# Patient Record
Sex: Female | Born: 1970 | Race: Black or African American | Hispanic: No | Marital: Single
Health system: Southern US, Community
[De-identification: ages and names within clinical notes are randomized; demographics above are authoritative.]

## PROBLEM LIST (undated history)

## (undated) DIAGNOSIS — M069 Rheumatoid arthritis, unspecified: Secondary | ICD-10-CM

## (undated) DIAGNOSIS — D571 Sickle-cell disease without crisis: Secondary | ICD-10-CM

---

## 2019-04-26 ENCOUNTER — Other Ambulatory Visit: Payer: Self-pay

## 2019-04-26 ENCOUNTER — Emergency Department (HOSPITAL_COMMUNITY)
Admission: EM | Admit: 2019-04-26 | Discharge: 2019-04-26 | Disposition: A | Discharge status: Home / Self Care | Payer: 59 | Attending: Emergency Medicine | Admitting: Emergency Medicine

## 2019-04-26 ENCOUNTER — Encounter (HOSPITAL_COMMUNITY): Payer: Self-pay | Admitting: Radiology

## 2019-04-26 ENCOUNTER — Emergency Department (HOSPITAL_COMMUNITY): Payer: 59

## 2019-04-26 DIAGNOSIS — R109 Unspecified abdominal pain: Secondary | ICD-10-CM

## 2019-04-26 DIAGNOSIS — E876 Hypokalemia: Secondary | ICD-10-CM | POA: Diagnosis not present

## 2019-04-26 DIAGNOSIS — R112 Nausea with vomiting, unspecified: Secondary | ICD-10-CM | POA: Insufficient documentation

## 2019-04-26 DIAGNOSIS — R197 Diarrhea, unspecified: Secondary | ICD-10-CM | POA: Diagnosis not present

## 2019-04-26 LAB — CBC WITH DIFFERENTIAL/PLATELET
Abs Immature Granulocytes: 0.01 10*3/uL (ref 0.00–0.07)
Basophils Absolute: 0 10*3/uL (ref 0.0–0.1)
Basophils Relative: 1 %
Eosinophils Absolute: 0 10*3/uL (ref 0.0–0.5)
Eosinophils Relative: 1 %
HCT: 45.8 % (ref 36.0–46.0)
Hemoglobin: 15.6 g/dL — ABNORMAL HIGH (ref 12.0–15.0)
Immature Granulocytes: 0 %
Lymphocytes Relative: 25 %
Lymphs Abs: 1.6 10*3/uL (ref 0.7–4.0)
MCH: 32.8 pg (ref 26.0–34.0)
MCHC: 34.1 g/dL (ref 30.0–36.0)
MCV: 96.4 fL (ref 80.0–100.0)
Monocytes Absolute: 0.6 10*3/uL (ref 0.1–1.0)
Monocytes Relative: 10 %
Neutro Abs: 4 10*3/uL (ref 1.7–7.7)
Neutrophils Relative %: 63 %
Platelets: 162 10*3/uL (ref 150–400)
RBC: 4.75 MIL/uL (ref 3.87–5.11)
RDW: 11.6 % (ref 11.5–15.5)
WBC: 6.3 10*3/uL (ref 4.0–10.5)
nRBC: 0 % (ref 0.0–0.2)

## 2019-04-26 LAB — COMPREHENSIVE METABOLIC PANEL
ALT: 26 U/L (ref 0–44)
AST: 40 U/L (ref 15–41)
Albumin: 4.6 g/dL (ref 3.5–5.0)
Alkaline Phosphatase: 47 U/L (ref 38–126)
Anion gap: 10 (ref 5–15)
BUN: 11 mg/dL (ref 6–20)
CO2: 26 mmol/L (ref 22–32)
Calcium: 9.6 mg/dL (ref 8.9–10.3)
Chloride: 100 mmol/L (ref 98–111)
Creatinine, Ser: 0.77 mg/dL (ref 0.44–1.00)
GFR calc Af Amer: >60 mL/min (ref >60)
GFR calc non Af Amer: >60 mL/min (ref >60)
Glucose, Bld: 93 mg/dL (ref 70–99)
Potassium: 3.1 mmol/L — ABNORMAL LOW (ref 3.5–5.1)
Sodium: 136 mmol/L (ref 135–145)
Total Bilirubin: 1.6 mg/dL — ABNORMAL HIGH (ref 0.3–1.2)
Total Protein: 7.6 g/dL (ref 6.5–8.1)

## 2019-04-26 LAB — URINALYSIS, ROUTINE W REFLEX MICROSCOPIC
Bilirubin Urine: NEGATIVE
Glucose, UA: NEGATIVE mg/dL
Hgb urine dipstick: NEGATIVE
Ketones, ur: 20 mg/dL — AB
Leukocytes,Ua: NEGATIVE
Nitrite: NEGATIVE
Protein, ur: NEGATIVE mg/dL
Specific Gravity, Urine: 1.035 — ABNORMAL HIGH (ref 1.005–1.030)
pH: 6 (ref 5.0–8.0)

## 2019-04-26 LAB — LIPASE, BLOOD: Lipase: 26 U/L (ref 11–51)

## 2019-04-26 LAB — POC OCCULT BLOOD, ED: Fecal Occult Bld: NEGATIVE

## 2019-04-26 MED ORDER — DICYCLOMINE HCL 20 MG PO TABS: 20.0000 mg | ORAL_TABLET | Freq: Two times a day (BID) | ORAL | 0 refills | Status: AC

## 2019-04-26 MED ORDER — IOHEXOL 300 MG/ML  SOLN
75.0000 mL | Freq: Once | INTRAMUSCULAR | Status: AC | PRN
  Administered 2019-04-26: 75 mL via INTRAVENOUS

## 2019-04-26 MED ORDER — PROMETHAZINE HCL 25 MG PO TABS: 25.0000 mg | ORAL_TABLET | Freq: Four times a day (QID) | ORAL | 0 refills | Status: AC | PRN

## 2019-04-26 MED ORDER — DICYCLOMINE HCL 10 MG PO CAPS
10.0000 mg | ORAL_CAPSULE | Freq: Once | ORAL | Status: AC
  Administered 2019-04-26: 10 mg via ORAL
  Filled 2019-04-26: qty 1

## 2019-04-26 MED ORDER — SODIUM CHLORIDE 0.9 % IV BOLUS
1000.0000 mL | Freq: Once | INTRAVENOUS | Status: AC
  Administered 2019-04-26: 11:00:00 1000 mL via INTRAVENOUS

## 2019-04-26 MED ORDER — POTASSIUM CHLORIDE CRYS ER 20 MEQ PO TBCR
40.0000 meq | EXTENDED_RELEASE_TABLET | Freq: Once | ORAL | Status: AC
  Administered 2019-04-26: 40 meq via ORAL
  Filled 2019-04-26: qty 2

## 2019-04-26 MED ORDER — POTASSIUM CHLORIDE CRYS ER 20 MEQ PO TBCR: 20.0000 meq | EXTENDED_RELEASE_TABLET | Freq: Two times a day (BID) | ORAL | 0 refills | Status: AC

## 2019-04-26 MED ORDER — MORPHINE SULFATE (PF) 4 MG/ML IV SOLN
4.0000 mg | Freq: Once | INTRAVENOUS | Status: AC
  Administered 2019-04-26: 11:00:00 4 mg via INTRAVENOUS
  Filled 2019-04-26: qty 1

## 2019-04-26 MED ORDER — SODIUM CHLORIDE (PF) 0.9 % IJ SOLN
INTRAMUSCULAR | Status: AC
  Filled 2019-04-26: qty 50

## 2019-04-26 MED ORDER — FAMOTIDINE 20 MG PO TABS: 20.0000 mg | ORAL_TABLET | Freq: Two times a day (BID) | ORAL | 0 refills | Status: AC

## 2019-04-26 MED ORDER — METOCLOPRAMIDE HCL 5 MG/ML IJ SOLN
10.0000 mg | Freq: Once | INTRAMUSCULAR | Status: AC
  Administered 2019-04-26: 10 mg via INTRAVENOUS
  Filled 2019-04-26: qty 2

## 2019-04-26 NOTE — ED Triage Notes (Signed)
Patient states she feels like there are two balls in her stomach on each side.

## 2019-04-26 NOTE — ED Triage Notes (Signed)
Patient presents with several complaints. Says she feels bloated. Her chest hurts. She is cold and hot. She vomited 4 times in two days. Hasn't been able to eat.

## 2019-04-26 NOTE — Discharge Instructions (Signed)
Please read and follow all provided instructions.  Your diagnoses today include:  1. Nausea vomiting and diarrhea   2. Abdominal cramps   3. Hypokalemia     Tests performed today include:  Blood counts and electrolytes - low potassium  Blood tests to check liver and kidney function  Blood tests to check pancreas function  Urine test to look for infection  CT scan - no signs of problems  Vital signs. See below for your results today.   Medications prescribed:   Phenergan (promethazine) - for nausea and vomiting   Bentyl - medication for intestinal cramps and spasms   Pepcid (famotidine) - antihistamine  You can find this medication over-the-counter.   DO NOT exceed:   20mg  Pepcid every 12 hours  Take any prescribed medications only as directed.  Home care instructions:   Follow any educational materials contained in this packet.  Follow-up instructions: Please follow-up with your primary care provider in the next 3 days for further evaluation of your symptoms.    Return instructions:  SEEK IMMEDIATE MEDICAL ATTENTION IF:  The pain does not go away or becomes severe   A temperature above 101F develops   Repeated vomiting occurs (multiple episodes)   The pain becomes localized to portions of the abdomen. The right side could possibly be appendicitis. In an adult, the left lower portion of the abdomen could be colitis or diverticulitis.   Blood is being passed in stools or vomit (bright red or black tarry stools)   You develop chest pain, difficulty breathing, dizziness or fainting, or become confused, poorly responsive, or inconsolable (young children)  If you have any other emergent concerns regarding your health  Additional Information: Abdominal (belly) pain can be caused by many things. Your caregiver performed an examination and possibly ordered blood/urine tests and imaging (CT scan, x-rays, ultrasound). Many cases can be observed and treated at  home after initial evaluation in the emergency department. Even though you are being discharged home, abdominal pain can be unpredictable. Therefore, you need a repeated exam if your pain does not resolve, returns, or worsens. Most patients with abdominal pain don't have to be admitted to the hospital or have surgery, but serious problems like appendicitis and gallbladder attacks can start out as nonspecific pain. Many abdominal conditions cannot be diagnosed in one visit, so follow-up evaluations are very important.  Your vital signs today were: BP (!) 147/98    Pulse 73    Temp 98.4 F (36.9 C) (Oral)    Resp 18    Ht 5\' 3"  (1.6 m)    Wt 50.8 kg    SpO2 100%    BMI 19.84 kg/m  If your blood pressure (bp) was elevated above 135/85 this visit, please have this repeated by your doctor within one month. --------------

## 2019-04-26 NOTE — ED Provider Notes (Signed)
[facility]   CSN: [id number] Arrival date & time: 04/26/19  0941     History   Chief Complaint Chief Complaint  Patient presents with   Abdominal Pain    HPI Carmen Hill is a 48 y.o. female.     Patient with history of tubal ligation (question hysterectomy, patient is not sure) presents with complaint of nausea, vomiting, and diarrhea.  Symptoms have been going on for 6 to 7 days.  Patient developed pain 5 days ago.  Pain is all over her abdomen, worse around her umbilicus.  It is cramping in nature.  It comes and goes.  Patient has had blood in her stool at times.  She describes a hard feeling like a "ball" in her abdomen.  Patient states that she has been constipated as well.  No urinary symptoms.  She denies fever.  She has had chest tightness with these episodes as well.  She has been able to keep down very little fluids.  She was [place] at a hospital in [place] She states that she had blood work there.  These records are not in care everywhere.       No past medical history on file.  There are no active problems to display for this patient.   The histories are not reviewed yet. Please review them in the "History" navigator section and refresh this SmartLink.   OB History   No obstetric history on file.      Home Medications    Prior to Admission medications   Not on File    Family History No family history on file.  Social History Social History   Tobacco Use   Smoking status: Not on file  Substance Use Topics   Alcohol use: Not on file   Drug use: Not on file     Allergies   Patient has no allergy information on record.   Review of Systems Review of Systems  Constitutional: Negative for fever.  HENT: Negative for rhinorrhea and sore throat.   Eyes: Negative for redness.  Respiratory: Positive for chest tightness. Negative for cough.   Cardiovascular: Negative for chest pain.    Gastrointestinal: Positive for abdominal pain, blood in stool, diarrhea, nausea and vomiting.  Genitourinary: Negative for dysuria.  Musculoskeletal: Negative for myalgias.  Skin: Negative for rash.  Neurological: Negative for headaches.     Physical Exam Updated Vital Signs BP (!) 151/97 (BP Location: Left Arm)    Pulse 73    Temp 98.4 F (36.9 C) (Oral)    Resp 16    Ht 5\' 3"  (1.6 m)    Wt 50.8 kg    SpO2 100%    BMI 19.84 kg/m   Physical Exam Vitals signs and nursing note reviewed.  Constitutional:      Appearance: She is well-developed.  HENT:     Head: Normocephalic and atraumatic.  Eyes:     General:        Right eye: No discharge.        Left eye: No discharge.     Conjunctiva/sclera: Conjunctivae normal.  Neck:     Musculoskeletal: Normal range of motion and neck supple.  Cardiovascular:     Rate and Rhythm: Normal rate and regular rhythm.     Heart sounds: Normal heart sounds.  Pulmonary:     Effort: Pulmonary effort is normal.     Breath sounds: Normal breath sounds.  Abdominal:     Palpations:  Abdomen is soft.     Tenderness: There is abdominal tenderness in the periumbilical area. There is no guarding or rebound. Negative signs include Murphy's sign and McBurney's sign.     Hernia: No hernia is present.  Skin:    General: Skin is warm and dry.  Neurological:     Mental Status: She is alert.      ED Treatments / Results  Labs (all labs ordered are listed, but only abnormal results are displayed) Labs Reviewed  CBC WITH DIFFERENTIAL/PLATELET - Abnormal; Notable for the following components:      Result Value   Hemoglobin 15.6 (*)    All other components within normal limits  COMPREHENSIVE METABOLIC PANEL - Abnormal; Notable for the following components:   Potassium 3.1 (*)    Total Bilirubin 1.6 (*)    All other components within normal limits  URINALYSIS, ROUTINE W REFLEX MICROSCOPIC - Abnormal; Notable for the following components:   Specific  Gravity, Urine 1.035 (*)    Ketones, ur 20 (*)    All other components within normal limits  LIPASE, BLOOD  POC OCCULT BLOOD, ED    EKG EKG Interpretation  Date/Time:  Wednesday April 26 2019 11:17:38 EDT Ventricular Rate:  59 PR Interval:    QRS Duration: 88 QT Interval:  427 QTC Calculation: 423 R Axis:   -81 Text Interpretation:  Sinus or ectopic atrial rhythm Short PR interval Left anterior fascicular block Anterolateral infarct, old No old tracing to compare Confirmed by Daleen Bo [phone]) on 04/26/2019 11:42:50 AM   Radiology Ct Abdomen Pelvis W Contrast  Result Date: 04/26/2019 CLINICAL DATA:  Abdominal pain, nausea, vomiting and diarrhea with alternating chills and fever for 5 days. EXAM: CT ABDOMEN AND PELVIS WITH CONTRAST TECHNIQUE: Multidetector CT imaging of the abdomen and pelvis was performed using the standard protocol following bolus administration of intravenous contrast. CONTRAST:  75 mL OMNIPAQUE IOHEXOL 300 MG/ML  SOLN COMPARISON:  None. FINDINGS: Lower chest: Lung bases are clear. No pleural or pericardial effusion. Hepatobiliary: No focal liver abnormality is seen. No gallstones, gallbladder wall thickening, or biliary dilatation. Pancreas: Unremarkable. No pancreatic ductal dilatation or surrounding inflammatory changes. Spleen: Normal in size without focal abnormality. Adrenals/Urinary Tract: Adrenal glands are unremarkable. Kidneys are normal, without renal calculi, focal lesion, or hydronephrosis. Bladder is unremarkable. Stomach/Bowel: Stomach is within normal limits. Appendix appears normal. No evidence of bowel wall thickening, distention, or inflammatory changes. Vascular/Lymphatic: No significant vascular findings are present. No enlarged abdominal or pelvic lymph nodes. Reproductive: Uterus and bilateral adnexa are unremarkable. Other: None. Musculoskeletal: Normal. IMPRESSION: Negative CT abdomen and pelvis. No finding to explain the patient's symptoms.  Electronically Signed   By: Inge Rise M.D.   On: 04/26/2019 12:05    Procedures Procedures (including critical care time)  Medications Ordered in ED Medications  sodium chloride (PF) 0.9 % injection (has no administration in time range)  metoCLOPramide (REGLAN) injection 10 mg (10 mg Intravenous Given 04/26/19 1100)  sodium chloride 0.9 % bolus 1,000 mL (1,000 mLs Intravenous New Bag/Given 04/26/19 1100)  morphine 4 MG/ML injection 4 mg (4 mg Intravenous Given 04/26/19 1101)  potassium chloride SA (K-DUR) CR tablet 40 mEq (40 mEq Oral Given 04/26/19 1154)  iohexol (OMNIPAQUE) 300 MG/ML solution 75 mL (75 mLs Intravenous Contrast Given 04/26/19 1132)     Initial Impression / Assessment and Plan / ED Course  I have reviewed the triage vital signs and the nursing notes.  Pertinent labs & imaging results that  were available during my care of the patient were reviewed by me and considered in my medical decision making (see chart for details).        Patient seen and examined. Work-up initiated. Medications ordered. She may need CT imaging given duration of symptoms, lack of improvement.    Vital signs reviewed and are as follows: BP (!) 151/97 (BP Location: Left Arm)    Pulse 73    Temp 98.4 F (36.9 C) (Oral)    Resp 16    Ht 5\' 3"  (1.6 m)    Wt 50.8 kg    SpO2 100%    BMI 19.84 kg/m   1:29 PM patient updated on results.  She is feeling better after she was able to sleep for a while.  Minimal generalized tenderness without rebound or guarding on reexam.    Patient has received IV fluids and oral potassium.  She is not vomiting.  Will discharge to home with Phenergan, Bentyl, potassium, Pepcid.  The patient was urged to return to the Emergency Department immediately with worsening of current symptoms, worsening abdominal pain, persistent vomiting, blood noted in stools, fever, or any other concerns. The patient verbalized understanding.   She does not have a PCP as she moved to the  area 6 months ago.  [facility] referral given.   Final Clinical Impressions(s) / ED Diagnoses   Final diagnoses:  Nausea vomiting and diarrhea  Abdominal cramps  Hypokalemia   N/V/D, cramps: Patient does have some mild hypokalemia, repleted.  Hemoccult negative.  Suspect mild dehydration, treated with IV fluids.  Given duration of symptoms, CT imaging performed and did not show a cause of the patient symptoms.  No signs of colitis, abscess, other obstruction.  Comfortable discharged home at this time with continued symptom control.  PCP referrals given.  Return structures as above.  ED Discharge Orders         Ordered    promethazine (PHENERGAN) 25 MG tablet  Every 6 hours PRN     04/26/19 1326    dicyclomine (BENTYL) 20 MG tablet  2 times daily     04/26/19 1326    famotidine (PEPCID) 20 MG tablet  2 times daily     04/26/19 1326    potassium chloride SA (K-DUR) 20 MEQ tablet  2 times daily     04/26/19 1328           Renne Crigler, PA-C 04/26/19 1331    Mancel Bale, MD 04/26/19 1635

## 2020-09-12 ENCOUNTER — Encounter (HOSPITAL_COMMUNITY): Payer: Self-pay

## 2020-09-12 ENCOUNTER — Emergency Department (HOSPITAL_COMMUNITY)
Admission: EM | Admit: 2020-09-12 | Discharge: 2020-09-12 | Disposition: A | Discharge status: Home / Self Care | Payer: 59 | Attending: Emergency Medicine | Admitting: Emergency Medicine

## 2020-09-12 ENCOUNTER — Other Ambulatory Visit: Payer: Self-pay

## 2020-09-12 ENCOUNTER — Emergency Department (HOSPITAL_COMMUNITY): Payer: 59

## 2020-09-12 DIAGNOSIS — D571 Sickle-cell disease without crisis: Secondary | ICD-10-CM | POA: Diagnosis not present

## 2020-09-12 DIAGNOSIS — R1110 Vomiting, unspecified: Secondary | ICD-10-CM | POA: Diagnosis not present

## 2020-09-12 DIAGNOSIS — Z79899 Other long term (current) drug therapy: Secondary | ICD-10-CM | POA: Diagnosis not present

## 2020-09-12 DIAGNOSIS — R109 Unspecified abdominal pain: Secondary | ICD-10-CM

## 2020-09-12 DIAGNOSIS — R1013 Epigastric pain: Secondary | ICD-10-CM | POA: Insufficient documentation

## 2020-09-12 HISTORY — DX: Sickle-cell disease without crisis: D57.1

## 2020-09-12 HISTORY — DX: Rheumatoid arthritis, unspecified: M06.9

## 2020-09-12 LAB — URINALYSIS, ROUTINE W REFLEX MICROSCOPIC
Bacteria, UA: NONE SEEN
Bilirubin Urine: NEGATIVE
Glucose, UA: NEGATIVE mg/dL
Hgb urine dipstick: NEGATIVE
Ketones, ur: 20 mg/dL — AB
Leukocytes,Ua: NEGATIVE
Nitrite: NEGATIVE
Protein, ur: 30 mg/dL — AB
Specific Gravity, Urine: >1.046 — ABNORMAL HIGH (ref 1.005–1.030)
pH: 7 (ref 5.0–8.0)

## 2020-09-12 LAB — COMPREHENSIVE METABOLIC PANEL
ALT: 33 U/L (ref 0–44)
AST: 44 U/L — ABNORMAL HIGH (ref 15–41)
Albumin: 4.6 g/dL (ref 3.5–5.0)
Alkaline Phosphatase: 58 U/L (ref 38–126)
Anion gap: 12 (ref 5–15)
BUN: 11 mg/dL (ref 6–20)
CO2: 20 mmol/L — ABNORMAL LOW (ref 22–32)
Calcium: 8.5 mg/dL — ABNORMAL LOW (ref 8.9–10.3)
Chloride: 107 mmol/L (ref 98–111)
Creatinine, Ser: 0.48 mg/dL (ref 0.44–1.00)
GFR, Estimated: >60 mL/min (ref >60)
Glucose, Bld: 112 mg/dL — ABNORMAL HIGH (ref 70–99)
Potassium: 3.9 mmol/L (ref 3.5–5.1)
Sodium: 139 mmol/L (ref 135–145)
Total Bilirubin: 1 mg/dL (ref 0.3–1.2)
Total Protein: 7.6 g/dL (ref 6.5–8.1)

## 2020-09-12 LAB — CBC
HCT: 43.3 % (ref 36.0–46.0)
Hemoglobin: 14.2 g/dL (ref 12.0–15.0)
MCH: 33.2 pg (ref 26.0–34.0)
MCHC: 32.8 g/dL (ref 30.0–36.0)
MCV: 101.2 fL — ABNORMAL HIGH (ref 80.0–100.0)
Platelets: 151 10*3/uL (ref 150–400)
RBC: 4.28 MIL/uL (ref 3.87–5.11)
RDW: 12.1 % (ref 11.5–15.5)
WBC: 5.8 10*3/uL (ref 4.0–10.5)
nRBC: 0 % (ref 0.0–0.2)

## 2020-09-12 LAB — RETICULOCYTES
Immature Retic Fract: 9.8 % (ref 2.3–15.9)
RBC.: 4.3 MIL/uL (ref 3.87–5.11)
Retic Count, Absolute: 66.7 10*3/uL (ref 19.0–186.0)
Retic Ct Pct: 1.6 % (ref 0.4–3.1)

## 2020-09-12 LAB — I-STAT BETA HCG BLOOD, ED (MC, WL, AP ONLY): I-stat hCG, quantitative: <5 m[IU]/mL (ref <5)

## 2020-09-12 LAB — LIPASE, BLOOD: Lipase: 26 U/L (ref 11–51)

## 2020-09-12 LAB — VALPROIC ACID LEVEL: Valproic Acid Lvl: <10 ug/mL — ABNORMAL LOW (ref 50.0–100.0)

## 2020-09-12 MED ORDER — IOHEXOL 300 MG/ML  SOLN
75.0000 mL | Freq: Once | INTRAMUSCULAR | Status: AC | PRN
  Administered 2020-09-12: 75 mL via INTRAVENOUS

## 2020-09-12 MED ORDER — LACTATED RINGERS IV BOLUS
1000.0000 mL | Freq: Once | INTRAVENOUS | Status: AC
  Administered 2020-09-12: 1000 mL via INTRAVENOUS

## 2020-09-12 MED ORDER — ONDANSETRON 4 MG PO TBDP: 4.0000 mg | ORAL_TABLET | Freq: Three times a day (TID) | ORAL | 0 refills | Status: AC | PRN

## 2020-09-12 MED ORDER — SODIUM CHLORIDE 0.9 % IV BOLUS
1000.0000 mL | Freq: Once | INTRAVENOUS | Status: AC
  Administered 2020-09-12: 1000 mL via INTRAVENOUS

## 2020-09-12 MED ORDER — HALOPERIDOL LACTATE 5 MG/ML IJ SOLN
1.0000 mg | Freq: Once | INTRAMUSCULAR | Status: AC
  Administered 2020-09-12: 1 mg via INTRAVENOUS
  Filled 2020-09-12: qty 1

## 2020-09-12 MED ORDER — LORAZEPAM 2 MG/ML IJ SOLN
0.5000 mg | Freq: Once | INTRAMUSCULAR | Status: AC
  Administered 2020-09-12: 0.5 mg via INTRAVENOUS
  Filled 2020-09-12: qty 1

## 2020-09-12 MED ORDER — MORPHINE SULFATE (PF) 4 MG/ML IV SOLN
4.0000 mg | Freq: Once | INTRAVENOUS | Status: AC
Start: 2020-09-12 — End: 2020-09-12
  Administered 2020-09-12: 4 mg via INTRAVENOUS
  Filled 2020-09-12: qty 1

## 2020-09-12 NOTE — ED Provider Notes (Signed)
50 year old female with past medical history of sickle cell disease brought in by EMS from home.  Patient states that she woke up at 2:00 this morning with nausea, vomiting, abdominal cramping.  States that this is happened frequently in the past and needs IV fluids.  Denies sick contacts, diarrhea, fevers.  Patient was given Zofran, fentanyl, IV fluids by EMS however removed her IV in the lobby.  On exam found to have mild generalized abdominal pain.  Patient is sliding out of the chair and stands up to put herself back into her chair.  MSE was initiated and I personally evaluated the patient and placed orders (if any) at  9:50 AM on September 12, 2020.  The patient appears stable so that the remainder of the MSE may be completed by another provider.   Jeannie Fend, PA-C 09/12/20 8177    Lorre Nick, MD 09/13/20 [phone]

## 2020-09-12 NOTE — ED Provider Notes (Signed)
Medical Decision Making: Care of patient assumed from Dr. Freida Busman at 1500.  Agree with history, physical exam and plan.  See their note for further details.  Briefly, The pt p/w ab pain with hx of thc use, SS disease, hx of similar presnetation, haldol ativan and morphine given, NBNB vomiting.   Current plan is as follows: IVF, CT, PO challenge  Laboratory studies are fairly unremarkable, CT imaging after radiology my review shows only heterogenous uterus, she is told to follow-up with her primary care providers about this.  No emergent findings.  She is feeling much better, she feels safe for discharge home.  Strict return precautions discussed.  Urinalysis reviewed by myself shows no significant findings for infection, some mild ketosis.  Safe for discharge home tolerating p.o.  Patient agrees return precautions discussed  I personally reviewed and interpreted all labs/imaging.      Sabino Donovan, MD 09/12/20 2130

## 2020-09-12 NOTE — ED Notes (Signed)
Patient transported to CT 

## 2020-09-12 NOTE — ED Triage Notes (Signed)
Patient arrived with complaints of NVD and cold sweats that started a few hours ago. Given 4mg  zofran fentanyl and 400cc fluids. Hx of sickle cell.

## 2020-09-12 NOTE — ED Notes (Signed)
Pt unable to urinate at this time, states "I have nothing in me to pee out"

## 2020-09-12 NOTE — ED Notes (Signed)
Patient had an IV that was placed prior to arrival by EMS. Patient pulled the IV out while in the lobby. patient had bleeding from the site. Patient's arm cleaned and a new blanket was given. Patient placed back in the lobby.

## 2020-09-12 NOTE — ED Provider Notes (Signed)
[facility] Provider Note   CSN: [id number] Arrival date & time: 09/12/20  5366     History Chief Complaint  Patient presents with  . Abdominal Pain    Carmen Hill is a 50 y.o. female.  50 year old female presents with diffuse abdominal discomfort.  Patient notes daily use of marijuana.  States has had this before in the past and old records show that she was seen 2 years ago for similar symptoms.  Denies any fever or chills.  Emesis has been persistent.  Abdominal pain is been epigastric and persistent.  Denies any diarrhea.  No urinary symptoms.  No treatment use prior to arrival.  Nothing makes her symptoms worse        Past Medical History:  Diagnosis Date  . Rheumatoid arthritis (HCC)   . Sickle cell anemia (HCC)     There are no problems to display for this patient.   History reviewed. No pertinent surgical history.   OB History   No obstetric history on file.     No family history on file.     Home Medications Prior to Admission medications   Medication Sig Start Date End Date Taking? Authorizing Provider  ARIPiprazole (ABILIFY PO) Take 1 tablet by mouth daily.    [provider]  dicyclomine (BENTYL) 20 MG tablet Take 1 tablet (20 mg total) by mouth 2 (two) times daily. 04/26/19   Renne Crigler, PA-C  divalproex (DEPAKOTE) 250 MG DR tablet Take 250 mg by mouth 3 (three) times daily. 11/11/18   [provider]  famotidine (PEPCID) 20 MG tablet Take 1 tablet (20 mg total) by mouth 2 (two) times daily. 04/26/19   Renne Crigler, PA-C  LORazepam (ATIVAN PO) Take 1 tablet by mouth as needed (anxiety).    [provider]  potassium chloride SA (K-DUR) 20 MEQ tablet Take 1 tablet (20 mEq total) by mouth 2 (two) times daily. 04/26/19   Renne Crigler, PA-C  promethazine (PHENERGAN) 25 MG tablet Take 1 tablet (25 mg total) by mouth every 6 (six) hours as needed for nausea or vomiting. 04/26/19   Renne Crigler, PA-C    Allergies    Patient has no known allergies.  Review of Systems   Review of Systems  All other systems reviewed and are negative.   Physical Exam Updated Vital Signs BP (!) 152/95   Pulse 64   Resp 18   SpO2 100%   Physical Exam Vitals and nursing note reviewed.  Constitutional:      General: She is not in acute distress.    Appearance: Normal appearance. She is well-developed and well-nourished. She is not toxic-appearing.  HENT:     Head: Normocephalic and atraumatic.  Eyes:     General: Lids are normal.     Extraocular Movements: EOM normal.     Conjunctiva/sclera: Conjunctivae normal.     Pupils: Pupils are equal, round, and reactive to light.  Neck:     Thyroid: No thyroid mass.     Trachea: No tracheal deviation.  Cardiovascular:     Rate and Rhythm: Normal rate and regular rhythm.     Heart sounds: Normal heart sounds. No murmur heard. No gallop.   Pulmonary:     Effort: Pulmonary effort is normal. No respiratory distress.     Breath sounds: Normal breath sounds. No stridor. No decreased breath sounds, wheezing, rhonchi or rales.  Abdominal:     General: Bowel sounds are normal. There is no  distension.     Palpations: Abdomen is soft.     Tenderness: There is generalized abdominal tenderness. There is no CVA tenderness, guarding or rebound.  Musculoskeletal:        General: No tenderness or edema. Normal range of motion.     Cervical back: Normal range of motion and neck supple.  Skin:    General: Skin is warm and dry.     Findings: No abrasion or rash.  Neurological:     Mental Status: She is alert and oriented to person, place, and time.     GCS: GCS eye subscore is 4. GCS verbal subscore is 5. GCS motor subscore is 6.     Cranial Nerves: No cranial nerve deficit.     Sensory: No sensory deficit.     Deep Tendon Reflexes: Strength normal.  Psychiatric:        Mood and Affect: Mood and affect normal.        Speech: Speech normal.         Behavior: Behavior normal.     ED Results / Procedures / Treatments   Labs (all labs ordered are listed, but only abnormal results are displayed) Labs Reviewed  COMPREHENSIVE METABOLIC PANEL - Abnormal; Notable for the following components:      Result Value   CO2 20 (*)    Glucose, Bld 112 (*)    Calcium 8.5 (*)    AST 44 (*)    All other components within normal limits  CBC - Abnormal; Notable for the following components:   MCV 101.2 (*)    All other components within normal limits  LIPASE, BLOOD  RETICULOCYTES  URINALYSIS, ROUTINE W REFLEX MICROSCOPIC  RAPID URINE DRUG SCREEN, HOSP PERFORMED  VALPROIC ACID LEVEL  I-STAT BETA HCG BLOOD, ED ([facility], [facility], [facility] ONLY)    EKG None  Radiology No results found.  Procedures Procedures   Medications Ordered in ED Medications  sodium chloride 0.9 % bolus 1,000 mL (has no administration in time range)  haloperidol lactate (HALDOL) injection 1 mg (has no administration in time range)  LORazepam (ATIVAN) injection 0.5 mg (has no administration in time range)  morphine 4 MG/ML injection 4 mg (has no administration in time range)  lactated ringers bolus 1,000 mL (has no administration in time range)    ED Course  I have reviewed the triage vital signs and the nursing notes.  Pertinent labs & imaging results that were available during my care of the patient were reviewed by me and considered in my medical decision making (see chart for details).    MDM Rules/Calculators/A&P                          Patient will be hydrated here with IV fluids.  Given antiemetics and pain medication.  Labs are without leukocytosis.  Abdominal CT ordered due to her diffuse abdominal comfort.  Will signout the patient to Dr. Myrtis Ser Final Clinical Impression(s) / ED Diagnoses Final diagnoses:  None    Rx / DC Orders ED Discharge Orders    None       Lorre Nick, MD 09/12/20 1435

## 2020-09-13 LAB — GC/CHLAMYDIA PROBE AMP (~~LOC~~) NOT AT ARMC
Chlamydia: NEGATIVE
Comment: NEGATIVE
Comment: NORMAL
Neisseria Gonorrhea: NEGATIVE

## 2021-03-02 IMAGING — CT CT ABD-PELV W/ CM
2 of 5 series · 16 of 46 positions shown, 18 images · IV contrast (OMNIPAQUE 300)
Comparison: None.

CLINICAL DATA: Abdominal pain, nausea, vomiting and diarrhea with
alternating chills and fever for 5 days.

EXAM:
CT ABDOMEN AND PELVIS WITH CONTRAST
TECHNIQUE: Multidetector CT imaging of the abdomen and pelvis was performed
using the standard protocol following bolus administration of
intravenous contrast.
CONTRAST:  75 mL OMNIPAQUE IOHEXOL 300 MG/ML  SOLN

[Series 2: axial st · axial · 0.64mm/px · z∈[-371,-51]mm · 13 of 76 slices shown, 15 images]
[im 6/76  soft-tissue]
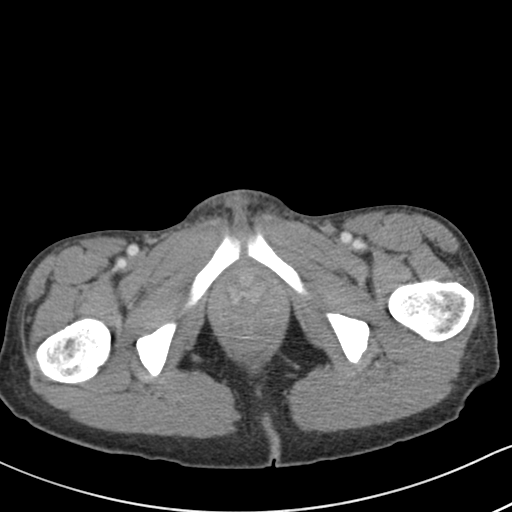
[im 6/76  bone]
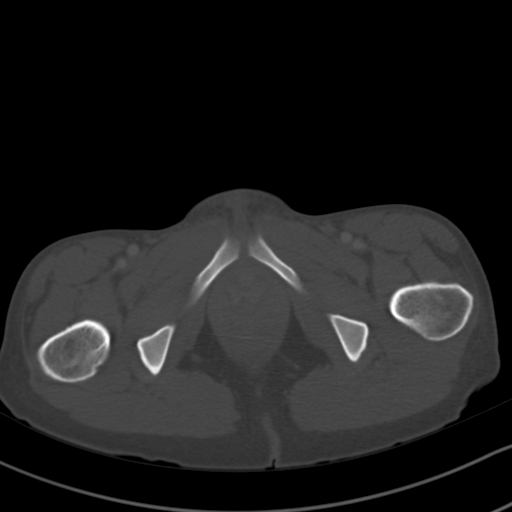
[im 11/76  soft-tissue]
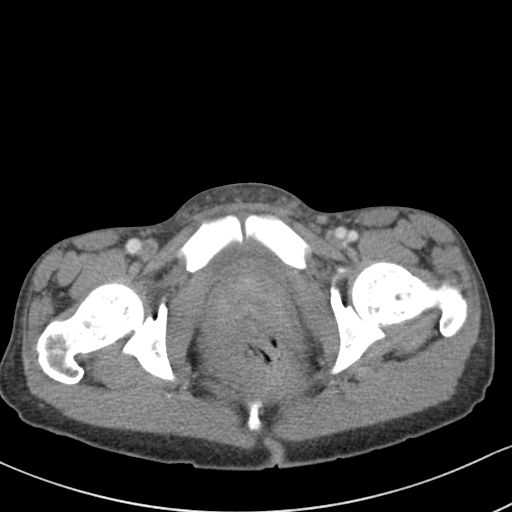
[im 17/76  soft-tissue]
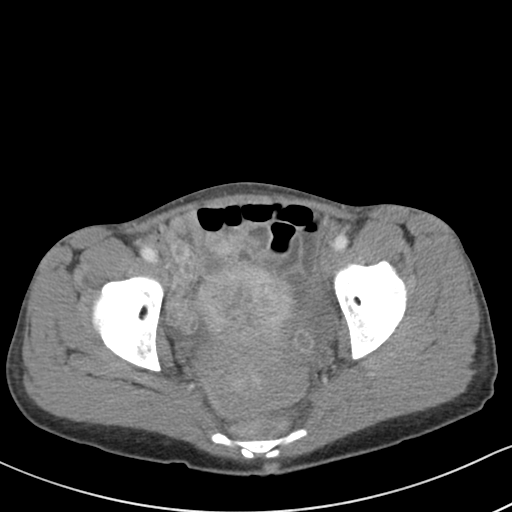
[im 22/76  soft-tissue]
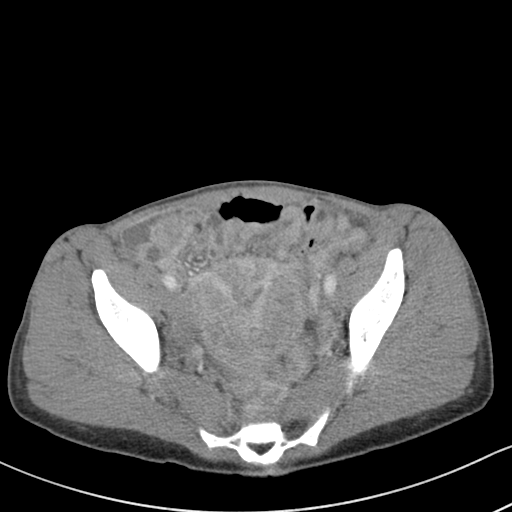
[im 27/76  soft-tissue]
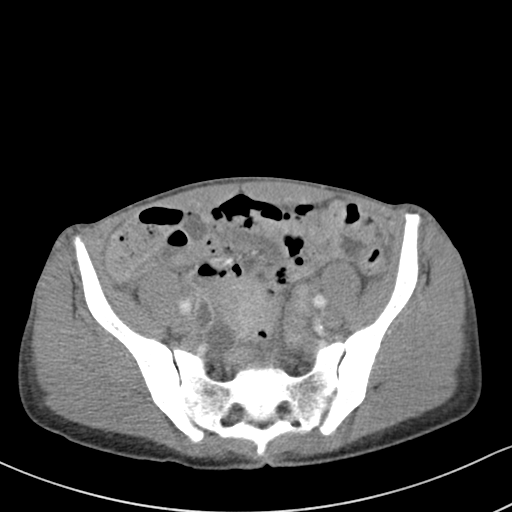
[im 33/76  soft-tissue]
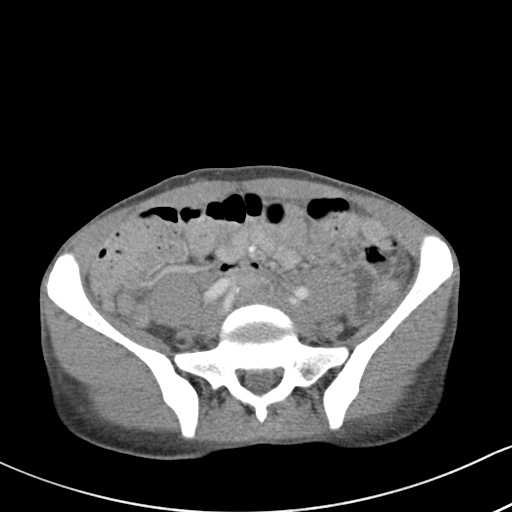
[im 38/76  soft-tissue]
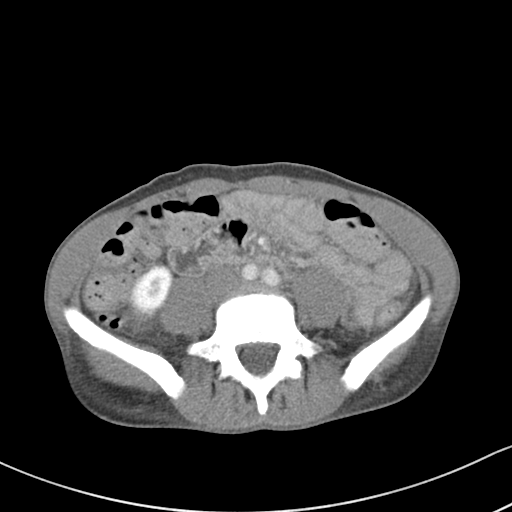
[im 43/76  soft-tissue]
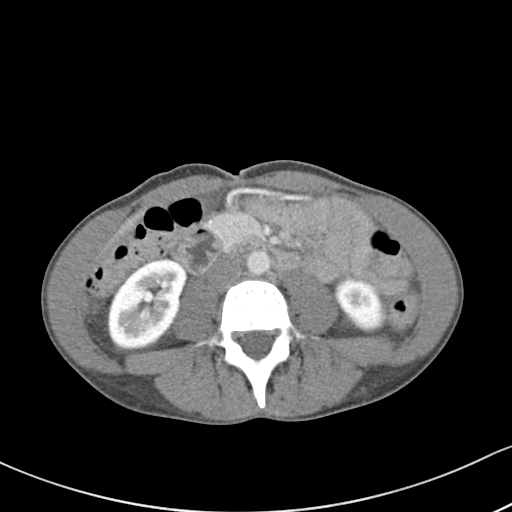
[im 49/76  soft-tissue]
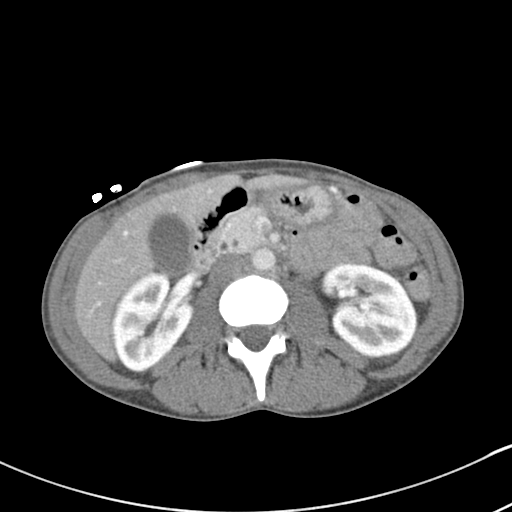
[im 49/76  bone]
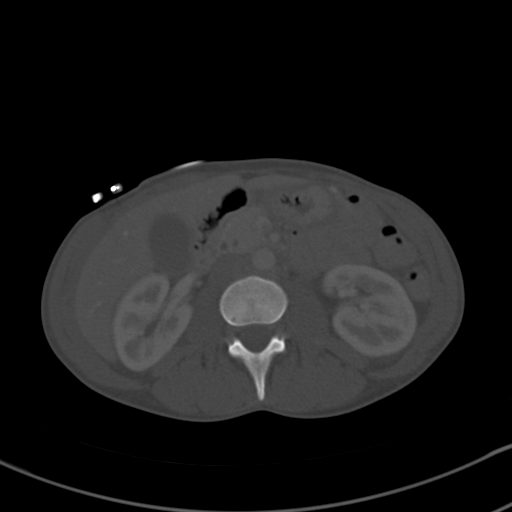
[im 54/76  soft-tissue]
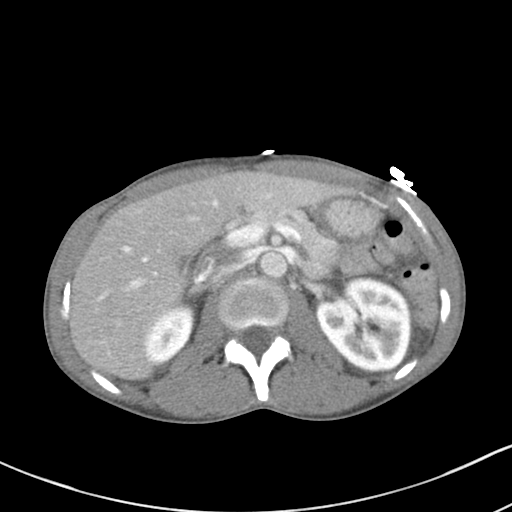
[im 59/76  soft-tissue]
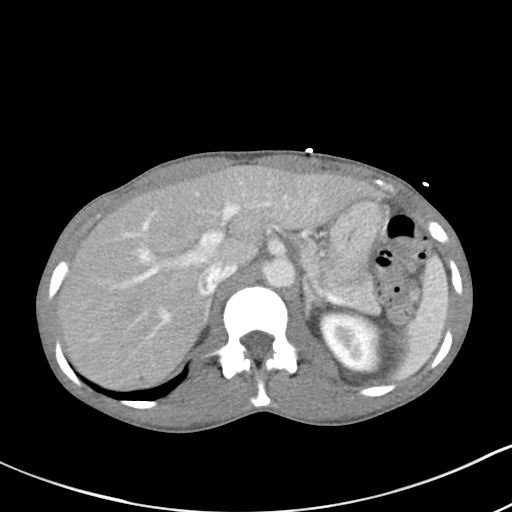
[im 65/76  soft-tissue]
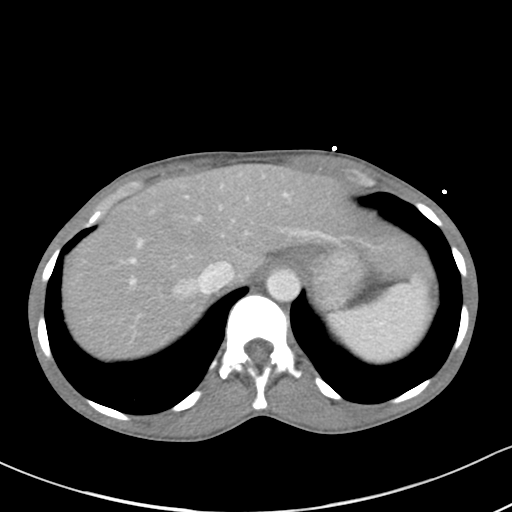
[im 70/76  soft-tissue]
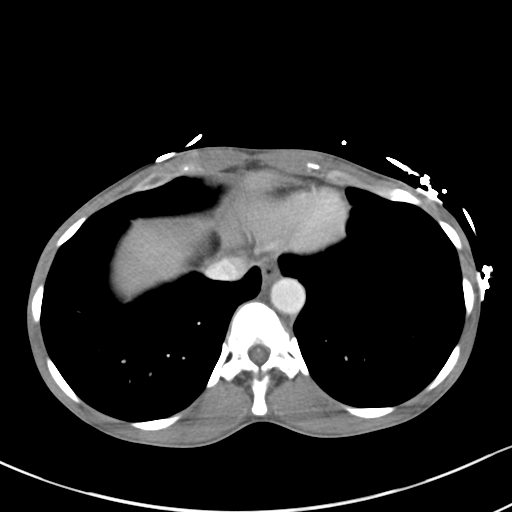

[Series 5: coronal st · coronal · 0.58mm/px · 3 of 97 slices shown]
[im 33/97  soft-tissue]
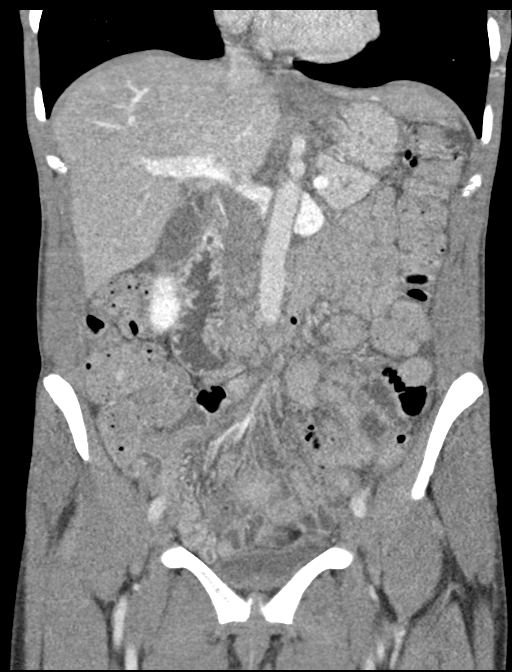
[im 43/97  soft-tissue]
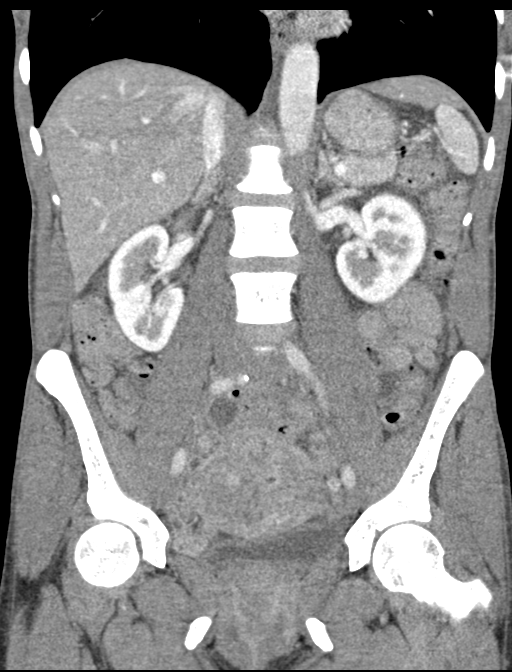
[im 54/97  soft-tissue]
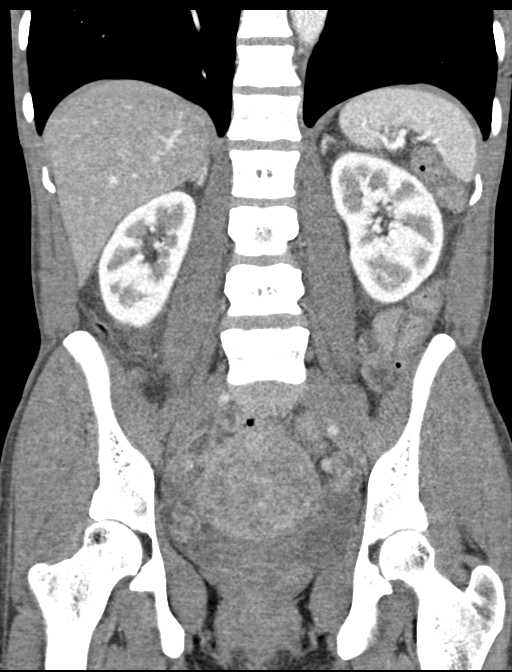

[16 of 46 positions shown; findings below may reference images not displayed]

FINDINGS: Lower chest: Lung bases are clear. No pleural or pericardial
effusion.

Hepatobiliary: No focal liver abnormality is seen. No gallstones,
gallbladder wall thickening, or biliary dilatation.

Pancreas: Unremarkable. No pancreatic ductal dilatation or
surrounding inflammatory changes.

Spleen: Normal in size without focal abnormality.

Adrenals/Urinary Tract: Adrenal glands are unremarkable. Kidneys are
normal, without renal calculi, focal lesion, or hydronephrosis.
Bladder is unremarkable.

Stomach/Bowel: Stomach is within normal limits. Appendix appears
normal. No evidence of bowel wall thickening, distention, or
inflammatory changes.

Vascular/Lymphatic: No significant vascular findings are present. No
enlarged abdominal or pelvic lymph nodes.

Reproductive: Uterus and bilateral adnexa are unremarkable.

Other: None.

Musculoskeletal: Normal.
IMPRESSION: Negative CT abdomen and pelvis. No finding to explain the patient's
symptoms.

## 2021-09-25 ENCOUNTER — Emergency Department (HOSPITAL_COMMUNITY)
Admission: EM | Admit: 2021-09-25 | Discharge: 2021-09-25 | Disposition: A | Discharge status: Home / Self Care | Payer: 59 | Attending: Emergency Medicine | Admitting: Emergency Medicine

## 2021-09-25 ENCOUNTER — Ambulatory Visit (HOSPITAL_COMMUNITY)
Admission: EM | Admit: 2021-09-25 | Discharge: 2021-09-25 | Disposition: A | Discharge status: Home / Self Care | Payer: 59 | Attending: Family | Admitting: Family

## 2021-09-25 ENCOUNTER — Encounter (HOSPITAL_COMMUNITY): Payer: Self-pay

## 2021-09-25 DIAGNOSIS — F309 Manic episode, unspecified: Secondary | ICD-10-CM | POA: Diagnosis present

## 2021-09-25 DIAGNOSIS — G4700 Insomnia, unspecified: Secondary | ICD-10-CM | POA: Diagnosis not present

## 2021-09-25 DIAGNOSIS — M7918 Myalgia, other site: Secondary | ICD-10-CM | POA: Insufficient documentation

## 2021-09-25 DIAGNOSIS — M2550 Pain in unspecified joint: Secondary | ICD-10-CM | POA: Diagnosis not present

## 2021-09-25 DIAGNOSIS — F418 Other specified anxiety disorders: Secondary | ICD-10-CM | POA: Diagnosis not present

## 2021-09-25 DIAGNOSIS — F3162 Bipolar disorder, current episode mixed, moderate: Secondary | ICD-10-CM

## 2021-09-25 MED ORDER — QUETIAPINE FUMARATE 25 MG PO TABS: 25.0000 mg | ORAL_TABLET | Freq: Every day | ORAL | 0 refills | Status: AC

## 2021-09-25 MED ORDER — KETOROLAC TROMETHAMINE 15 MG/ML IJ SOLN
15.0000 mg | Freq: Once | INTRAMUSCULAR | Status: AC
  Administered 2021-09-25: 15 mg via INTRAMUSCULAR
  Filled 2021-09-25: qty 1

## 2021-09-25 NOTE — Discharge Instructions (Signed)
Go to the behavioral health urgent care center now to be evaluated.
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
## 2021-09-25 NOTE — ED Notes (Signed)
Pt given AVS and F/U instructions. Verbalized understanding.  Also given PPJ sandwich and water.

## 2021-09-25 NOTE — ED Triage Notes (Signed)
Pt presents with c/o request for mental health assessment. Pt reports anxiety and reports that she is having panic attacks. Pt is calm and cooperative in triage, denies SI. Pt also c/o generalized body aches.

## 2021-09-25 NOTE — ED Notes (Signed)
One pt. Belongings bag placed in cabinet for 16-18.

## 2021-09-25 NOTE — ED Triage Notes (Signed)
Pt Carmen Hill was recently discharged from [facility] long ED today with a complaint of panic attacks and body aches. Pt states that Carmen Hill recommended that she come to [facility] for a mental health evaluation and medication management. Pt states that she has been out of her medication for 6 months. Pt states that she was diagnosed with Bipolar, manic depressive disorder, panic attacks, OCD, and anxiety. Pt states that since she has been off of her medication she has had insomnia, memory loss, agitation, crying spells, and loss of motivation. Pt states that she has a PCP but does not have an appointment set up. Pt denies SI/HI and AVH. Pt is routine.

## 2021-09-25 NOTE — Discharge Instructions (Addendum)
Patient is instructed prior to discharge to:  Take all medications as prescribed by his/her mental healthcare provider. Report any adverse effects and or reactions from the medicines to his/her outpatient provider promptly. Keep all scheduled appointments, to ensure that you are getting refills on time and to avoid any interruption in your medication.  If you are unable to keep an appointment call to reschedule.  Be sure to follow-up with resources and follow-up appointments provided.  Patient has been instructed & cautioned: To not engage in alcohol and or illegal drug use while on prescription medicines. In the event of worsening symptoms, patient is instructed to call the crisis hotline, 911 and or go to the nearest ED for appropriate evaluation and treatment of symptoms. To follow-up with his/her primary care provider for your other medical issues, concerns and or health care needs.  Please contact one of the following facilities to start medication management and therapy services:   [facility] [address]  [place], [place] [address] [phone]   [facility]  [address] [place], [place] [address] [phone]  [facility]  [address], [place], [place] [address] [phone]  [facility]  [address]  [place], [place] [address] [phone]  [facility]  [address] [place], [place] [address] [phone]  [facility]  [address],  [place], [place] [address] [phone]
